# Patient Record
Sex: Male | Born: 1965
Health system: Southern US, Community
[De-identification: ages and names within clinical notes are randomized; demographics above are authoritative.]

## PROBLEM LIST (undated history)

## (undated) HISTORY — PX: NASAL SINUS SURGERY: SHX719

## (undated) HISTORY — PX: ESOPHAGEAL DILATION: SHX303

---

## 2010-09-29 ENCOUNTER — Ambulatory Visit: Payer: Self-pay | Admitting: Family Medicine

## 2012-09-21 ENCOUNTER — Ambulatory Visit: Payer: Self-pay | Admitting: Otolaryngology

## 2013-11-13 IMAGING — RF DG BARIUM SWALLOW
8 series · 14 of 24 positions shown · non-contrast
Comparison: none

REASON FOR EXAM: dysphagia
COMMENTS:

[Series 1: fluoro_barium 2fps_bw · 0.17mm/px · 1 of 24 frames shown (1 of 8)]
[frame 4/24]
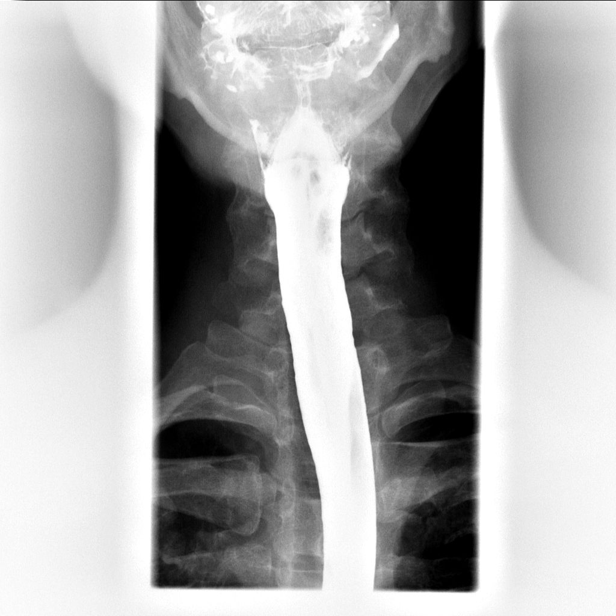

[Series 2: fluoro_barium 2fps_bw · 0.17mm/px · 2 of 24 frames shown (2 of 8)]
[frame 4/24]
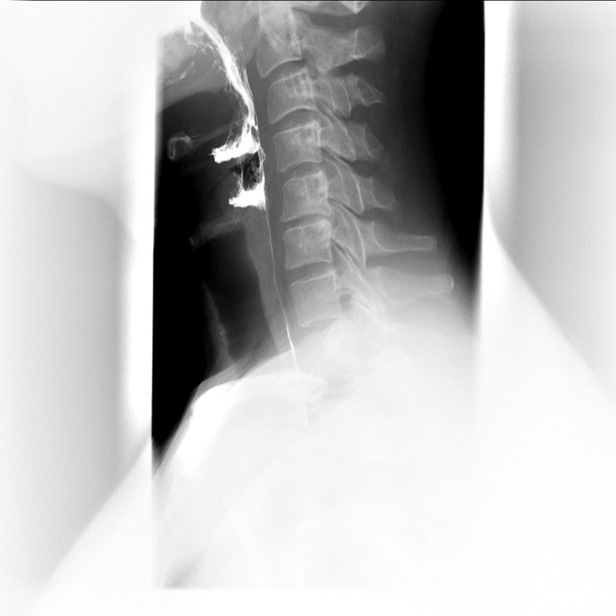
[frame 16/24]
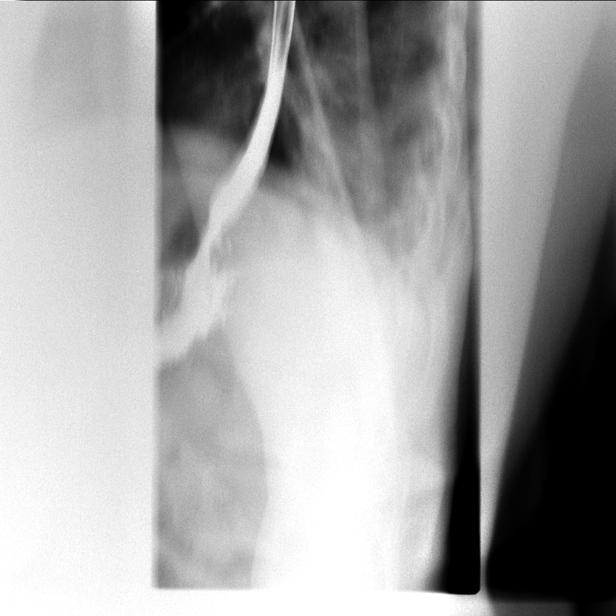

[Series 3: fluoro_barium 2fps_bw · 0.17mm/px · 2 of 27 frames shown (3 of 8)]
[frame 10/27]
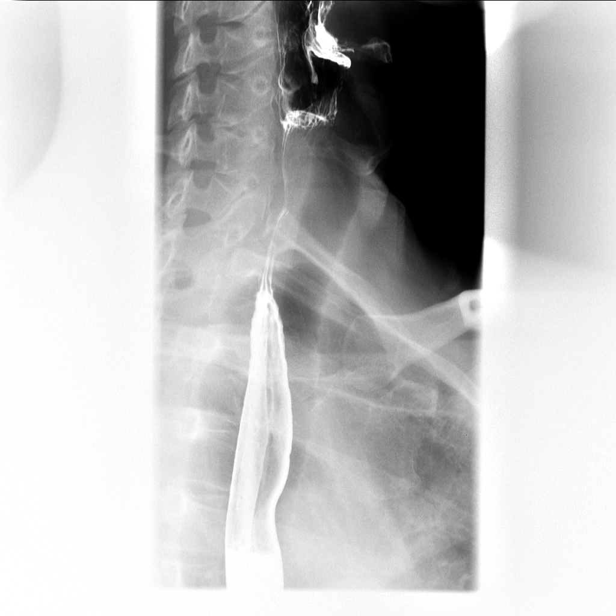
[frame 14/27]
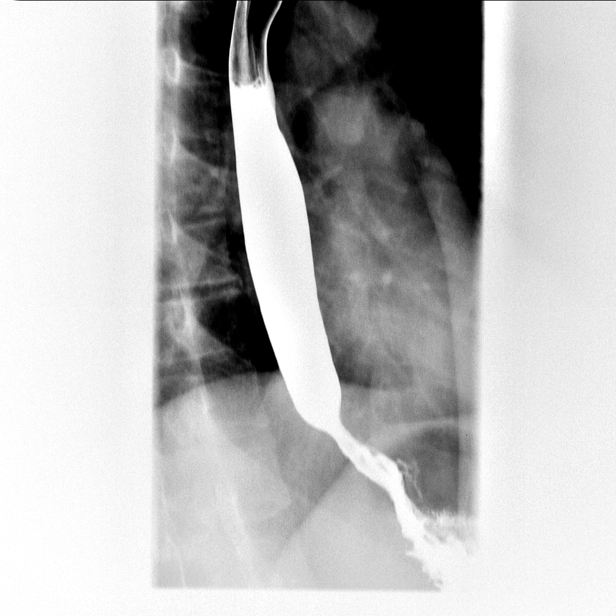

[Series 4: fluoro_barium 2fps_bw · 0.17mm/px · 2 of 39 frames shown (4 of 8)]
[frame 6/39]
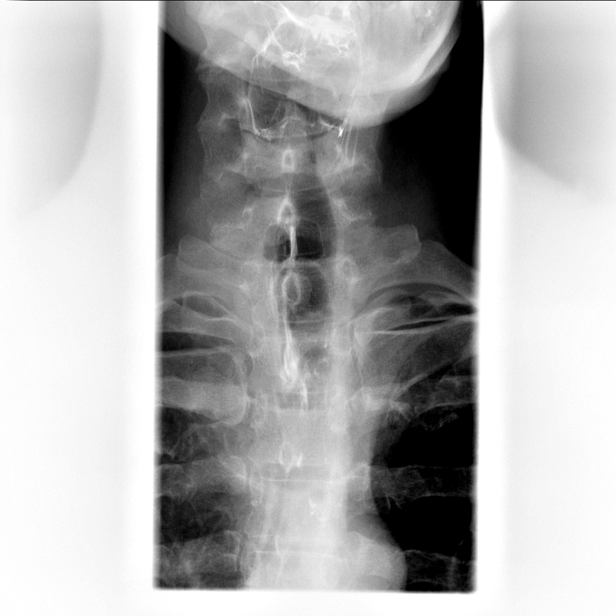
[frame 34/39]
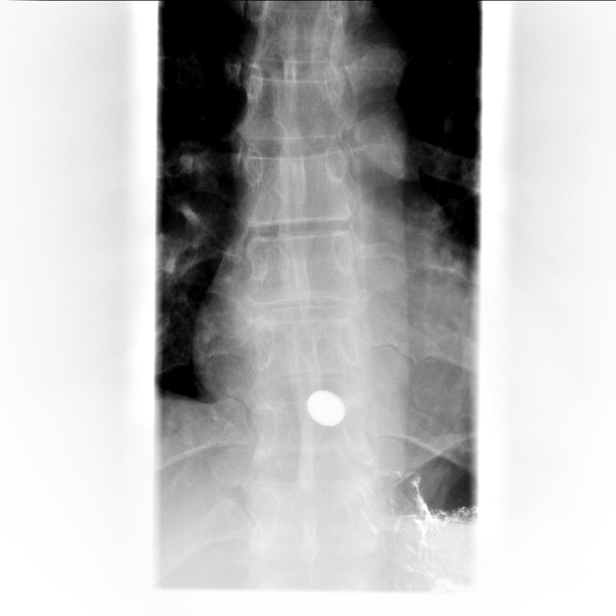

[Series 5: fluoro_barium 2fps_bw · 0.17mm/px · 2 of 27 frames shown (5 of 8)]
[frame 3/27]
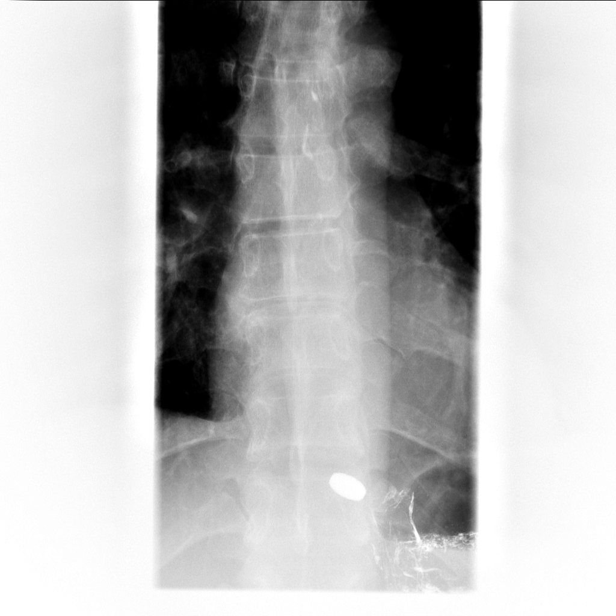
[frame 23/27]
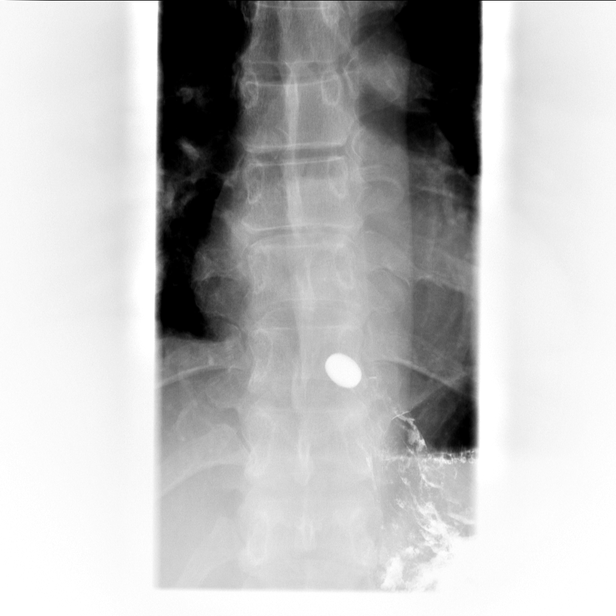

[Series 6: fluoro_barium 2fps_bw · 0.17mm/px · 1 of 14 frames shown (6 of 8)]
[frame 7/14]
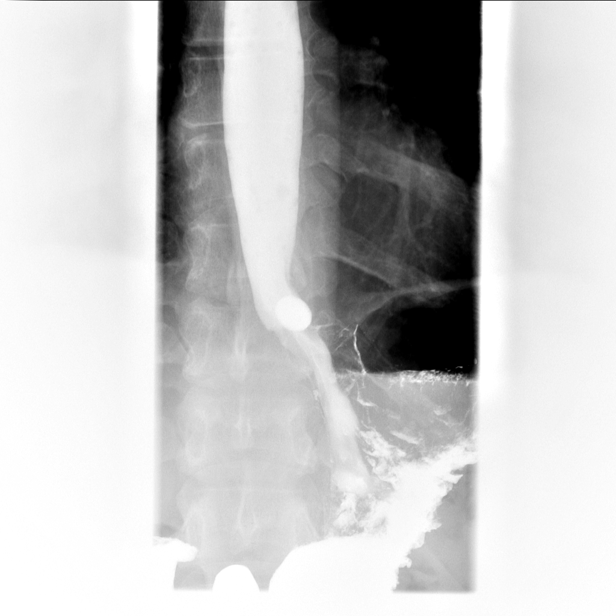

[Series 7: fluoro_barium 2fps_bw · 0.17mm/px · 3 of 33 frames shown (7 of 8)]
[frame 1/33]
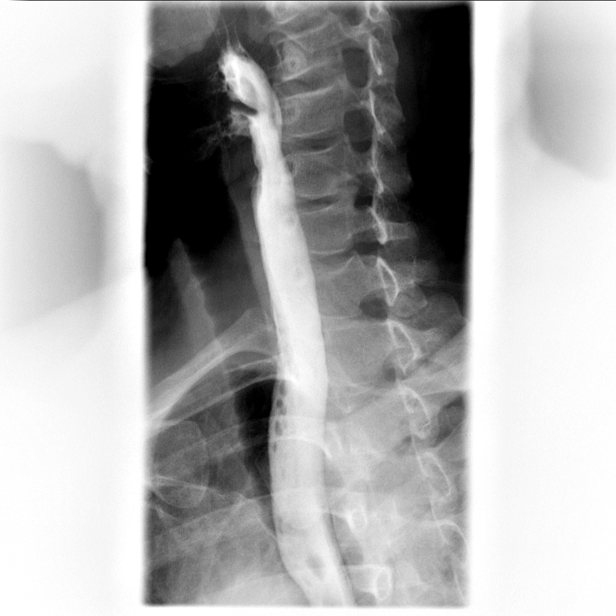
[frame 5/33]
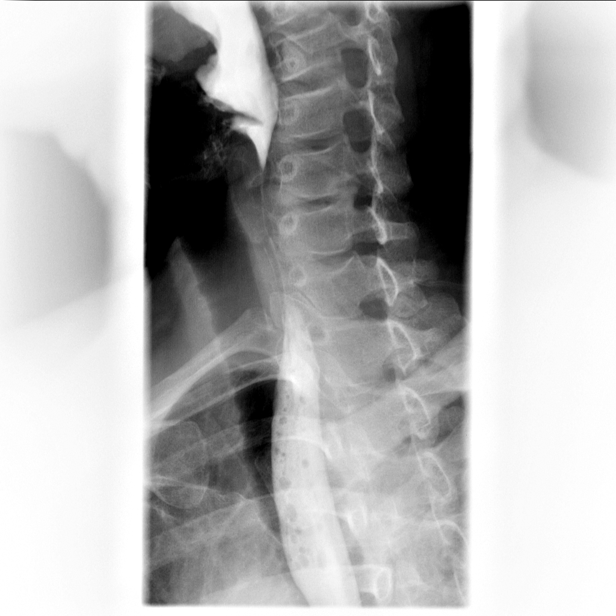
[frame 29/33]
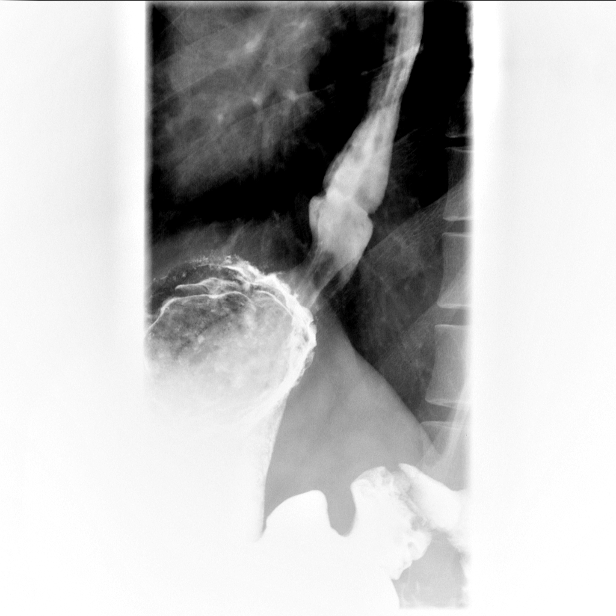

[Series 8: fluoro_barium 2fps_bw · 0.18mm/px · 1 of 30 frames shown (8 of 8)]
[frame 26/30]
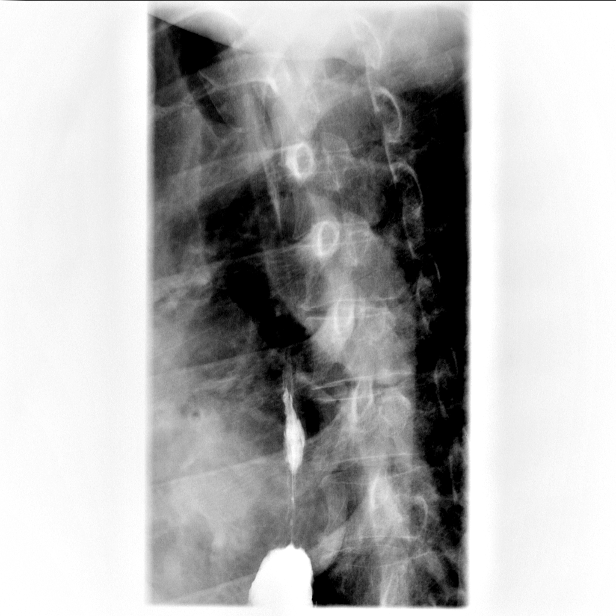

[14 of 24 positions shown; findings below may reference images not displayed]

PROCEDURE:     FL  - FL BARIUM SWALLOW  - September 21, 2012  [DATE]

RESULT:

Barium swallow examination shows the patient easily ingests the liquid
barium. The barium flows through the esophagus into the stomach without
impingement. There is no esophageal diverticulum or persistent stenosis
appreciated proximally or in the mid portion. A barium impregnated tablet
passed to the distal esophagus and paused. With additional swallows of
water, the tablet would not pass into the stomach. Additional large swallows
of barium helped propel the tablet into the stomach. Prone barium swallow in
a right anterior oblique projection demonstrates a small Schatzki's ring
which likely was the cause of the hesitance of passage of the barium tablet
into the stomach. A small, sliding-type, hiatal hernia appears present.
There is gastroesophageal reflux into the distal third of the esophagus with
reflux maneuvers.
IMPRESSION: No significant obstruction. Small, Schatzki's ring noted in
the distal esophagus with a sliding type, hiatal hernia. Gastroesophageal
reflux into the distal esophagus with reflux maneuvers. No esophageal
mucosal abnormality is evident.

[REDACTED]

## 2015-02-01 ENCOUNTER — Ambulatory Visit: Payer: Self-pay | Admitting: Family Medicine

## 2016-01-16 ENCOUNTER — Other Ambulatory Visit: Payer: Self-pay | Admitting: Family Medicine

## 2016-01-16 ENCOUNTER — Encounter: Payer: Self-pay | Admitting: Family Medicine

## 2016-01-16 ENCOUNTER — Ambulatory Visit (INDEPENDENT_AMBULATORY_CARE_PROVIDER_SITE_OTHER): Payer: BLUE CROSS/BLUE SHIELD | Admitting: Family Medicine

## 2016-01-16 VITALS — BP 124/84 | HR 68 | Temp 98.0°F | Ht 68.0 in | Wt 179.0 lb

## 2016-01-16 DIAGNOSIS — F329 Major depressive disorder, single episode, unspecified: Secondary | ICD-10-CM | POA: Diagnosis not present

## 2016-01-16 DIAGNOSIS — F32A Depression, unspecified: Secondary | ICD-10-CM | POA: Insufficient documentation

## 2016-01-16 DIAGNOSIS — Z0000 Encounter for general adult medical examination without abnormal findings: Secondary | ICD-10-CM

## 2016-01-16 LAB — UA/M W/RFLX CULTURE, ROUTINE
BILIRUBIN UA: NEGATIVE
GLUCOSE, UA: NEGATIVE
Ketones, UA: NEGATIVE
Leukocytes, UA: NEGATIVE
Nitrite, UA: NEGATIVE
Protein, UA: NEGATIVE
RBC UA: NEGATIVE
SPEC GRAV UA: 1.015 (ref 1.005–1.030)
UUROB: 1 mg/dL (ref 0.2–1.0)
pH, UA: 7 (ref 5.0–7.5)

## 2016-01-16 MED ORDER — FLUOXETINE HCL 20 MG PO TABS: ORAL_TABLET | ORAL | Status: DC

## 2016-01-16 NOTE — Patient Instructions (Signed)

## 2016-01-16 NOTE — Progress Notes (Signed)
BP 124/84 mmHg  Pulse 68  Temp(Src) 98 F (36.7 C)  Ht 5\' 8"  (1.727 m)  Wt 179 lb (81.194 kg)  BMI 27.22 kg/m2  SpO2 99%   Subjective:    Patient ID: Richard Houston, male    DOB: 1966/11/24, 50 y.o.   MRN: [id number]  HPI: Richard Houston is a 50 y.o. male presenting on 01/16/2016 for comprehensive medical examination. Current medical complaints include:  DEPRESSION Mood status: uncontrolled Satisfied with current treatment?: no Symptom severity: moderate  Duration of current treatment : Has been on things off and on- thinks he needs to go back on something Psychotherapy/counseling: no  Previous psychiatric medications: lexapro and prozac Depressed mood: yes Anxious mood: yes Anhedonia: no Significant weight loss or gain: no Insomnia: no  Fatigue: yes Feelings of worthlessness or guilt: yes Impaired concentration/indecisiveness: yes Suicidal ideations: no Hopelessness: no Crying spells: no Depression screen PHQ 2/9 01/16/2016  Decreased Interest 1  Down, Depressed, Hopeless 2  PHQ - 2 Score 3  Altered sleeping 3  Tired, decreased energy 3  Change in appetite 0  Feeling bad or failure about yourself  2  Trouble concentrating 2  Moving slowly or fidgety/restless 0  Suicidal thoughts 0  PHQ-9 Score 13  Difficult doing work/chores Somewhat difficult    He currently lives with: his wife and kids Interim Problems from his last visit: no  The patient does not have a history of falls. I did not complete a risk assessment for falls. A plan of care for falls was not documented.  Past Medical History:  History reviewed. No pertinent past medical history.  Surgical History:  Past Surgical History  Procedure Laterality Date  . Nasal sinus surgery      X 2  . Esophageal dilation      Medications:  Current Outpatient Prescriptions on File Prior to Visit  Medication Sig  . omeprazole (PRILOSEC) 20 MG capsule Take 20 mg by mouth daily.   No current  facility-administered medications on file prior to visit.    Allergies:  Allergies  Allergen Reactions  . Lexapro [Escitalopram] Anxiety    Dizziness    Social History:  Social History   Social History  . Marital Status: Married    Spouse Name: N/A  . Number of Children: N/A  . Years of Education: N/A   Occupational History  . Not on file.   Social History Main Topics  . Smoking status: Never Smoker   . Smokeless tobacco: Never Used  . Alcohol Use: Yes     Comment: Socially  . Drug Use: No  . Sexual Activity: Yes    Birth Control/ Protection: None   Other Topics Concern  . Not on file   Social History Narrative   History  Smoking status  . Never Smoker   Smokeless tobacco  . Never Used   History  Alcohol Use  . Yes    Comment: Socially    Family History:  Family History  Problem Relation Age of Onset  . Stroke Father   . Alcohol abuse Father   . Heart disease Father     Past medical history, surgical history, medications, allergies, family history and social history reviewed with patient today and changes made to appropriate areas of the chart.   Review of Systems  Constitutional: Negative.   HENT: Negative.   Eyes: Negative.   Respiratory: Negative.   Cardiovascular: Negative.   Gastrointestinal: Positive for heartburn. Negative for nausea, vomiting, abdominal pain, diarrhea, constipation,  blood in stool and melena.  Genitourinary: Negative.   Musculoskeletal: Negative.   Skin: Negative.   Neurological: Negative.   Endo/Heme/Allergies: Positive for environmental allergies. Negative for polydipsia. Does not bruise/bleed easily.  Psychiatric/Behavioral: Positive for depression. Negative for suicidal ideas, hallucinations, memory loss and substance abuse. The patient is not nervous/anxious and does not have insomnia.     All other ROS negative except what is listed above and in the HPI.      Objective:    BP 124/84 mmHg  Pulse 68   Temp(Src) 98 F (36.7 C)  Ht  (1.727 m)  Wt 179 lb (81.194 kg)  BMI 27.22 kg/m2  SpO2 99%  Wt Readings from Last 3 Encounters:  01/16/16 179 lb (81.194 kg)  12/10/13 187 lb (84.823 kg)    Physical Exam  Constitutional: He is oriented to person, place, and time. He appears well-developed and well-nourished. No distress.  HENT:  Head: Normocephalic and atraumatic.  Right Ear: Hearing, tympanic membrane, external ear and ear canal normal.  Left Ear: Hearing, tympanic membrane, external ear and ear canal normal.  Nose: Nose normal.  Mouth/Throat: Uvula is midline, oropharynx is clear and moist and mucous membranes are normal. No oropharyngeal exudate.  Eyes: Conjunctivae, EOM and lids are normal. Pupils are equal, round, and reactive to light. Right eye exhibits no discharge. Left eye exhibits no discharge. No scleral icterus.  Neck: Normal range of motion. Neck supple. No JVD present. No tracheal deviation present. No thyromegaly present.  Cardiovascular: Normal rate, regular rhythm, normal heart sounds and intact distal pulses.  Exam reveals no gallop and no friction rub.   No murmur heard. Pulmonary/Chest: Effort normal and breath sounds normal. No stridor. No respiratory distress. He has no wheezes. He has no rales. He exhibits no tenderness.  Abdominal: Soft. Bowel sounds are normal. He exhibits no distension and no mass. There is no tenderness. There is no rebound and no guarding. Hernia confirmed negative in the right inguinal area and confirmed negative in the left inguinal area.  Genitourinary: Rectum normal, testes normal and penis normal. Prostate is enlarged. Prostate is not tender. Cremasteric reflex is present. Right testis shows no mass, no swelling and no tenderness. Right testis is descended. Cremasteric reflex is not absent on the right side. Left testis shows no mass, no swelling and no tenderness. Left testis is descended. Cremasteric reflex is not absent on the left  side. Circumcised. No phimosis, paraphimosis, hypospadias, penile erythema or penile tenderness. No discharge found.  Musculoskeletal: Normal range of motion. He exhibits no edema or tenderness.  Lymphadenopathy:    He has no cervical adenopathy.       Right: No inguinal adenopathy present.       Left: No inguinal adenopathy present.  Neurological: He is alert and oriented to person, place, and time. He has normal reflexes. He displays normal reflexes. No cranial nerve deficit. He exhibits normal muscle tone. Coordination normal.  Skin: Skin is warm, dry and intact. No rash noted. He is not diaphoretic. No erythema. No pallor.  Psychiatric: He has a normal mood and affect. His speech is normal and behavior is normal. Judgment and thought content normal. Cognition and memory are normal.  Nursing note and vitals reviewed.   No results found for this or any previous visit.    Assessment & Plan:   Problem List Items Addressed This Visit      Other   Depression    Not under good control. Having a  lot of issues at home. Will start on low dose prozac and check back in in 1 month.       Relevant Medications   FLUoxetine (PROZAC) 20 MG tablet    Other Visit Diagnoses    Routine general medical examination at a health care facility    -  Primary    Screening labs checked today. Up to date on Tdap. Refused flu. Colonoscopy up to date. Work on diet and exercise.         Discussed aspirin prophylaxis for myocardial infarction prevention and decision was it was not indicated  LABORATORY TESTING:  Health maintenance labs ordered today as discussed above.   The natural history of prostate cancer and ongoing controversy regarding screening and potential treatment outcomes of prostate cancer has been discussed with the patient. The meaning of a false positive PSA and a false negative PSA has been discussed. He indicates understanding of the limitations of this screening test and wishes to proceed  with screening PSA testing.   IMMUNIZATIONS:   - Tdap: Tetanus vaccination status reviewed: last tetanus booster within 10 years. - Influenza: Refused - Pneumovax: Not applicable  SCREENING: - Colonoscopy: Up to date  Discussed with patient purpose of the colonoscopy is to detect colon cancer at curable precancerous or early stages   PATIENT COUNSELING:    Sexuality: Discussed sexually transmitted diseases, partner selection, use of condoms, avoidance of unintended pregnancy  and contraceptive alternatives.   Advised to avoid cigarette smoking.  I discussed with the patient that most people either abstain from alcohol or drink within safe limits (<=14/week and <=4 drinks/occasion for males, <=7/weeks and <= 3 drinks/occasion for females) and that the risk for alcohol disorders and other health effects rises proportionally with the number of drinks per week and how often a drinker exceeds daily limits.  Discussed cessation/primary prevention of drug use and availability of treatment for abuse.   Diet: Encouraged to adjust caloric intake to maintain  or achieve ideal body weight, to reduce intake of dietary saturated fat and total fat, to limit sodium intake by avoiding high sodium foods and not adding table salt, and to maintain adequate dietary potassium and calcium preferably from fresh fruits, vegetables, and low-fat dairy products.    stressed the importance of regular exercise  Injury prevention: Discussed safety belts, safety helmets, smoke detector, smoking near bedding or upholstery.   Dental health: Discussed importance of regular tooth brushing, flossing, and dental visits.   Follow up plan: NEXT PREVENTATIVE PHYSICAL DUE IN 1 YEAR. Return in about 4 weeks (around 02/13/2016) for follow up depression.

## 2016-01-16 NOTE — Assessment & Plan Note (Signed)
Not under good control. Having a lot of issues at home. Will start on low dose prozac and check back in in 1 month.

## 2016-01-17 LAB — LIPID PANEL W/O CHOL/HDL RATIO
CHOLESTEROL TOTAL: 170 mg/dL (ref 100–199)
HDL: 61 mg/dL (ref >39)
LDL CALC: 89 mg/dL (ref 0–99)
TRIGLYCERIDES: 98 mg/dL (ref 0–149)
VLDL Cholesterol Cal: 20 mg/dL (ref 5–40)

## 2016-01-17 LAB — CBC WITH DIFFERENTIAL/PLATELET
BASOS ABS: 0.1 10*3/uL (ref 0.0–0.2)
Basos: 1 %
EOS (ABSOLUTE): 0.4 10*3/uL (ref 0.0–0.4)
EOS: 7 %
Hematocrit: 42.1 % (ref 37.5–51.0)
Hemoglobin: 14.5 g/dL (ref 12.6–17.7)
IMMATURE GRANULOCYTES: 0 %
Immature Grans (Abs): 0 10*3/uL (ref 0.0–0.1)
Lymphocytes Absolute: 2.1 10*3/uL (ref 0.7–3.1)
Lymphs: 33 %
MCH: 31.3 pg (ref 26.6–33.0)
MCHC: 34.4 g/dL (ref 31.5–35.7)
MCV: 91 fL (ref 79–97)
MONOS ABS: 0.6 10*3/uL (ref 0.1–0.9)
Monocytes: 10 %
NEUTROS PCT: 49 %
Neutrophils Absolute: 3.1 10*3/uL (ref 1.4–7.0)
PLATELETS: 187 10*3/uL (ref 150–379)
RBC: 4.63 x10E6/uL (ref 4.14–5.80)
RDW: 13.7 % (ref 12.3–15.4)
WBC: 6.3 10*3/uL (ref 3.4–10.8)

## 2016-01-17 LAB — COMPREHENSIVE METABOLIC PANEL
ALK PHOS: 72 IU/L (ref 39–117)
ALT: 14 IU/L (ref 0–44)
AST: 16 IU/L (ref 0–40)
Albumin/Globulin Ratio: 2.4 (ref 1.1–2.5)
Albumin: 4.5 g/dL (ref 3.5–5.5)
BUN/Creatinine Ratio: 16 (ref 9–20)
BUN: 16 mg/dL (ref 6–24)
Bilirubin Total: 0.7 mg/dL (ref 0.0–1.2)
CALCIUM: 9.1 mg/dL (ref 8.7–10.2)
CO2: 26 mmol/L (ref 18–29)
CREATININE: 0.98 mg/dL (ref 0.76–1.27)
Chloride: 102 mmol/L (ref 96–106)
GFR calc Af Amer: 103 mL/min/{1.73_m2} (ref >59)
GFR, EST NON AFRICAN AMERICAN: 89 mL/min/{1.73_m2} (ref >59)
GLUCOSE: 89 mg/dL (ref 65–99)
Globulin, Total: 1.9 g/dL (ref 1.5–4.5)
Potassium: 4.7 mmol/L (ref 3.5–5.2)
Sodium: 144 mmol/L (ref 134–144)
Total Protein: 6.4 g/dL (ref 6.0–8.5)

## 2016-01-17 LAB — TSH: TSH: 1.75 u[IU]/mL (ref 0.450–4.500)

## 2016-01-17 LAB — PSA: Prostate Specific Ag, Serum: 0.4 ng/mL (ref 0.0–4.0)

## 2016-01-19 ENCOUNTER — Encounter: Payer: Self-pay | Admitting: Family Medicine

## 2016-03-18 ENCOUNTER — Other Ambulatory Visit: Payer: Self-pay | Admitting: Family Medicine

## 2016-05-21 DIAGNOSIS — D125 Benign neoplasm of sigmoid colon: Secondary | ICD-10-CM | POA: Diagnosis not present

## 2016-05-21 DIAGNOSIS — K5730 Diverticulosis of large intestine without perforation or abscess without bleeding: Secondary | ICD-10-CM | POA: Diagnosis not present

## 2016-05-21 DIAGNOSIS — K449 Diaphragmatic hernia without obstruction or gangrene: Secondary | ICD-10-CM | POA: Diagnosis not present

## 2016-05-21 DIAGNOSIS — Z7951 Long term (current) use of inhaled steroids: Secondary | ICD-10-CM | POA: Diagnosis not present

## 2016-05-21 DIAGNOSIS — Z1211 Encounter for screening for malignant neoplasm of colon: Secondary | ICD-10-CM | POA: Diagnosis not present

## 2016-05-21 DIAGNOSIS — D126 Benign neoplasm of colon, unspecified: Secondary | ICD-10-CM | POA: Diagnosis not present

## 2016-05-21 DIAGNOSIS — K210 Gastro-esophageal reflux disease with esophagitis: Secondary | ICD-10-CM | POA: Diagnosis not present

## 2016-05-21 DIAGNOSIS — K200 Eosinophilic esophagitis: Secondary | ICD-10-CM | POA: Diagnosis not present

## 2016-05-21 DIAGNOSIS — K219 Gastro-esophageal reflux disease without esophagitis: Secondary | ICD-10-CM | POA: Diagnosis not present

## 2016-05-21 DIAGNOSIS — Z79899 Other long term (current) drug therapy: Secondary | ICD-10-CM | POA: Diagnosis not present

## 2016-05-21 DIAGNOSIS — K222 Esophageal obstruction: Secondary | ICD-10-CM | POA: Diagnosis not present

## 2016-05-21 DIAGNOSIS — R1310 Dysphagia, unspecified: Secondary | ICD-10-CM | POA: Diagnosis not present

## 2016-05-21 DIAGNOSIS — K635 Polyp of colon: Secondary | ICD-10-CM | POA: Diagnosis not present

## 2016-05-21 DIAGNOSIS — D123 Benign neoplasm of transverse colon: Secondary | ICD-10-CM | POA: Diagnosis not present

## 2016-05-21 DIAGNOSIS — D120 Benign neoplasm of cecum: Secondary | ICD-10-CM | POA: Diagnosis not present

## 2016-05-21 DIAGNOSIS — D122 Benign neoplasm of ascending colon: Secondary | ICD-10-CM | POA: Diagnosis not present

## 2016-05-21 DIAGNOSIS — K228 Other specified diseases of esophagus: Secondary | ICD-10-CM | POA: Diagnosis not present

## 2016-05-21 DIAGNOSIS — J45909 Unspecified asthma, uncomplicated: Secondary | ICD-10-CM | POA: Diagnosis not present

## 2016-11-28 DIAGNOSIS — J019 Acute sinusitis, unspecified: Secondary | ICD-10-CM | POA: Diagnosis not present

## 2016-12-01 ENCOUNTER — Other Ambulatory Visit: Payer: Self-pay | Admitting: Family Medicine

## 2016-12-01 NOTE — Telephone Encounter (Signed)
First prescribed on 01/16/16. A&P from that date stated, "Return in about 4 weeks (around 02/13/2016) for follow up depression. " No followed kept. No upcoming visit on file.

## 2016-12-01 NOTE — Telephone Encounter (Signed)
He shouldn't have had enough to be taking it consistently. Please find out if he's been taking it, either way, he needs an appointment.

## 2016-12-02 NOTE — Telephone Encounter (Signed)
Called patient, he is at work, he states that he will call back.

## 2016-12-03 ENCOUNTER — Other Ambulatory Visit: Payer: Self-pay

## 2016-12-03 NOTE — Telephone Encounter (Signed)
Routing to provider  

## 2016-12-03 NOTE — Telephone Encounter (Signed)
He should not have had enough to be taking it consistently. Please find out if he only started it 1 month ago, or if he has been out of it entirely. Either way, he will need an appointment, it will just depend if he gets a new Rx or if he needs to wait.

## 2017-05-11 ENCOUNTER — Encounter: Payer: Self-pay | Admitting: Family Medicine

## 2017-05-11 ENCOUNTER — Ambulatory Visit (INDEPENDENT_AMBULATORY_CARE_PROVIDER_SITE_OTHER): Payer: BLUE CROSS/BLUE SHIELD | Admitting: Family Medicine

## 2017-05-11 VITALS — BP 133/87 | HR 73 | Temp 98.2°F | Ht 68.4 in | Wt 184.5 lb

## 2017-05-11 DIAGNOSIS — Z0000 Encounter for general adult medical examination without abnormal findings: Secondary | ICD-10-CM

## 2017-05-11 DIAGNOSIS — Z114 Encounter for screening for human immunodeficiency virus [HIV]: Secondary | ICD-10-CM

## 2017-05-11 DIAGNOSIS — F3342 Major depressive disorder, recurrent, in full remission: Secondary | ICD-10-CM

## 2017-05-11 DIAGNOSIS — Z125 Encounter for screening for malignant neoplasm of prostate: Secondary | ICD-10-CM

## 2017-05-11 DIAGNOSIS — Z1159 Encounter for screening for other viral diseases: Secondary | ICD-10-CM | POA: Diagnosis not present

## 2017-05-11 DIAGNOSIS — J301 Allergic rhinitis due to pollen: Secondary | ICD-10-CM

## 2017-05-11 LAB — MICROSCOPIC EXAMINATION
Bacteria, UA: NONE SEEN
WBC UA: NONE SEEN /HPF (ref 0–?)

## 2017-05-11 LAB — UA/M W/RFLX CULTURE, ROUTINE
Bilirubin, UA: NEGATIVE
GLUCOSE, UA: NEGATIVE
Ketones, UA: NEGATIVE
Leukocytes, UA: NEGATIVE
NITRITE UA: NEGATIVE
Protein, UA: NEGATIVE
RBC, UA: NEGATIVE
Specific Gravity, UA: 1.01 (ref 1.005–1.030)
UUROB: 0.2 mg/dL (ref 0.2–1.0)
pH, UA: 7 (ref 5.0–7.5)

## 2017-05-11 MED ORDER — LORATADINE 10 MG PO TABS: 10.0000 mg | ORAL_TABLET | Freq: Every day | ORAL | 11 refills | Status: AC

## 2017-05-11 MED ORDER — FLUTICASONE PROPIONATE 50 MCG/ACT NA SUSP: 2.0000 | Freq: Every day | NASAL | 11 refills | Status: AC

## 2017-05-11 NOTE — Progress Notes (Signed)
BP 133/87 (BP Location: Left Arm, Patient Position: Sitting, Cuff Size: Normal)   Pulse 73   Temp 98.2 F (36.8 C)   Ht 5' 8.4" (1.737 m)   Wt 184 lb 8 oz (83.7 kg)   SpO2 98%   BMI 27.73 kg/m    Subjective:    Patient ID: Richard Houston, male    DOB: 16-Jul-1966, 51 y.o.   MRN: [id number]  HPI: Richard Houston is a 51 y.o. male presenting on 05/11/2017 for comprehensive medical examination. Current medical complaints include:  DEPRESSION- resolved, off meds, feeling well.   ALLERGIES Duration: chronic Runny nose: yes  Nasal congestion: yes Nasal itching: yes Sneezing: yes Eye swelling, itching or discharge: yes Post nasal drip: yes Cough: yes, non-productive Sinus pressure: yes  Ear pain: no  Ear pressure: no  Fever: no  Symptoms occur seasonally: yes Symptoms occur perenially: yes Satisfied with current treatment: no Allergist evaluation in past: no Allergen injection immunotherapy: no Recurrent sinus infections: no ENT evaluation in past: no Known environmental allergy: yes Indoor pets: yes History of asthma: no Current allergy medications: none Treatments attempted: loratidine  He currently lives with: wife and son Interim Problems from his last visit: no  Depression Screen done today and results listed below:  Depression screen [facility] 2/9 05/11/2017 01/16/2016  Decreased Interest 0 1  Down, Depressed, Hopeless 0 2  PHQ - 2 Score 0 3  Altered sleeping - 3  Tired, decreased energy - 3  Change in appetite - 0  Feeling bad or failure about yourself  - 2  Trouble concentrating - 2  Moving slowly or fidgety/restless - 0  Suicidal thoughts - 0  PHQ-9 Score - 13  Difficult doing work/chores - Somewhat difficult    Past Medical History:  History reviewed. No pertinent past medical history.  Surgical History:  Past Surgical History:  Procedure Laterality Date  . ESOPHAGEAL DILATION    . NASAL SINUS SURGERY     X 2    Medications:  Current Outpatient  Prescriptions on File Prior to Visit  Medication Sig  . omeprazole (PRILOSEC) 20 MG capsule Take 20 mg by mouth daily.   No current facility-administered medications on file prior to visit.     Allergies:  Allergies  Allergen Reactions  . Lexapro [Escitalopram] Anxiety    Dizziness    Social History:  Social History   Social History  . Marital status: Married    Spouse name: N/A  . Number of children: N/A  . Years of education: N/A   Occupational History  . Not on file.   Social History Main Topics  . Smoking status: Never Smoker  . Smokeless tobacco: Never Used  . Alcohol use Yes     Comment: Socially  . Drug use: No  . Sexual activity: Yes    Birth control/ protection: None   Other Topics Concern  . Not on file   Social History Narrative  . No narrative on file   History  Smoking Status  . Never Smoker  Smokeless Tobacco  . Never Used   History  Alcohol Use  . Yes    Comment: Socially    Family History:  Family History  Problem Relation Age of Onset  . Stroke Father   . Alcohol abuse Father   . Heart disease Father     Past medical history, surgical history, medications, allergies, family history and social history reviewed with patient today and changes made to appropriate areas of the  chart.   Review of Systems  Constitutional: Negative.   HENT: Positive for congestion. Negative for ear discharge, ear pain, hearing loss, nosebleeds, sinus pain, sore throat and tinnitus.   Eyes: Negative.   Respiratory: Negative.  Negative for stridor.   Cardiovascular: Negative.   Gastrointestinal: Positive for heartburn. Negative for abdominal pain, blood in stool, constipation, diarrhea, melena, nausea and vomiting.  Genitourinary: Negative.   Musculoskeletal: Negative.   Skin: Negative.   Neurological: Negative.   Endo/Heme/Allergies: Positive for environmental allergies. Negative for polydipsia. Does not bruise/bleed easily.  Psychiatric/Behavioral:  Negative.     All other ROS negative except what is listed above and in the HPI.      Objective:    BP 133/87 (BP Location: Left Arm, Patient Position: Sitting, Cuff Size: Normal)   Pulse 73   Temp 98.2 F (36.8 C)   Ht 5' 8.4" (1.737 m)   Wt 184 lb 8 oz (83.7 kg)   SpO2 98%   BMI 27.73 kg/m   Wt Readings from Last 3 Encounters:  05/11/17 184 lb 8 oz (83.7 kg)  01/16/16 179 lb (81.2 kg)  12/10/13 187 lb (84.8 kg)    Physical Exam  Constitutional: He is oriented to person, place, and time. He appears well-developed and well-nourished. No distress.  HENT:  Head: Normocephalic and atraumatic.  Right Ear: Hearing, tympanic membrane, external ear and ear canal normal.  Left Ear: Hearing, tympanic membrane, external ear and ear canal normal.  Nose: Nose normal.  Mouth/Throat: Uvula is midline, oropharynx is clear and moist and mucous membranes are normal. No oropharyngeal exudate.  Eyes: Conjunctivae, EOM and lids are normal. Pupils are equal, round, and reactive to light. Right eye exhibits no discharge. Left eye exhibits no discharge. No scleral icterus.  Neck: Normal range of motion. Neck supple. No JVD present. No tracheal deviation present. No thyromegaly present.  Cardiovascular: Normal rate, regular rhythm, normal heart sounds and intact distal pulses.  Exam reveals no gallop and no friction rub.   No murmur heard. Pulmonary/Chest: Effort normal and breath sounds normal. No stridor. No respiratory distress. He has no wheezes. He has no rales. He exhibits no tenderness.  Abdominal: Soft. Bowel sounds are normal. He exhibits no distension and no mass. There is no tenderness. There is no rebound and no guarding. Hernia confirmed negative in the right inguinal area and confirmed negative in the left inguinal area.  Genitourinary: Rectum normal, testes normal and penis normal. Prostate is enlarged. Prostate is not tender. Right testis shows no mass, no swelling and no tenderness.  Right testis is descended. Cremasteric reflex is not absent on the right side. Left testis shows no mass, no swelling and no tenderness. Left testis is descended. Cremasteric reflex is not absent on the left side. Circumcised. No phimosis, paraphimosis, hypospadias, penile erythema or penile tenderness. No discharge found.  Musculoskeletal: Normal range of motion. He exhibits no edema, tenderness or deformity.  Lymphadenopathy:    He has no cervical adenopathy.  Neurological: He is alert and oriented to person, place, and time. He has normal reflexes. He displays normal reflexes. No cranial nerve deficit. He exhibits normal muscle tone. Coordination normal.  Skin: Skin is warm, dry and intact. No rash noted. He is not diaphoretic. No erythema. No pallor.  Psychiatric: He has a normal mood and affect. His speech is normal and behavior is normal. Judgment and thought content normal. Cognition and memory are normal.  Nursing note and vitals reviewed.   Results for  orders placed or performed in visit on 01/16/16  CBC with Differential/Platelet  Result Value Ref Range   WBC 6.3 3.4 - 10.8 x10E3/uL   RBC 4.63 4.14 - 5.80 x10E6/uL   Hemoglobin 14.5 12.6 - 17.7 g/dL   Hematocrit 96.0 45.4 - 51.0 %   MCV 91 79 - 97 fL   MCH 31.3 26.6 - 33.0 pg   MCHC 34.4 31.5 - 35.7 g/dL   RDW 09.8 11.9 - 14.7 %   Platelets 187 150 - 379 x10E3/uL   Neutrophils 49 %   Lymphs 33 %   Monocytes 10 %   Eos 7 %   Basos 1 %   Neutrophils Absolute 3.1 1.4 - 7.0 x10E3/uL   Lymphocytes Absolute 2.1 0.7 - 3.1 x10E3/uL   Monocytes Absolute 0.6 0.1 - 0.9 x10E3/uL   EOS (ABSOLUTE) 0.4 0.0 - 0.4 x10E3/uL   Basophils Absolute 0.1 0.0 - 0.2 x10E3/uL   Immature Granulocytes 0 %   Immature Grans (Abs) 0.0 0.0 - 0.1 x10E3/uL  Comprehensive metabolic panel  Result Value Ref Range   Glucose 89 65 - 99 mg/dL   BUN 16 6 - 24 mg/dL   Creatinine, Ser 8.29 0.76 - 1.27 mg/dL   GFR calc non Af Amer 89 >59 mL/min/1.73   GFR calc  Af Amer 103 >59 mL/min/1.73   BUN/Creatinine Ratio 16 9 - 20   Sodium 144 134 - 144 mmol/L   Potassium 4.7 3.5 - 5.2 mmol/L   Chloride 102 96 - 106 mmol/L   CO2 26 18 - 29 mmol/L   Calcium 9.1 8.7 - 10.2 mg/dL   Total Protein 6.4 6.0 - 8.5 g/dL   Albumin 4.5 3.5 - 5.5 g/dL   Globulin, Total 1.9 1.5 - 4.5 g/dL   Albumin/Globulin Ratio 2.4 1.1 - 2.5   Bilirubin Total 0.7 0.0 - 1.2 mg/dL   Alkaline Phosphatase 72 39 - 117 IU/L   AST 16 0 - 40 IU/L   ALT 14 0 - 44 IU/L  Lipid Panel w/o Chol/HDL Ratio  Result Value Ref Range   Cholesterol, Total 170 100 - 199 mg/dL   Triglycerides 98 0 - 149 mg/dL   HDL 61 >56 mg/dL   VLDL Cholesterol Cal 20 5 - 40 mg/dL   LDL Calculated 89 0 - 99 mg/dL  PSA  Result Value Ref Range   Prostate Specific Ag, Serum 0.4 0.0 - 4.0 ng/mL  TSH  Result Value Ref Range   TSH 1.750 0.450 - 4.500 uIU/mL  UA/M w/rflx Culture, Routine  Result Value Ref Range   Specific Gravity, UA 1.015 1.005 - 1.030   pH, UA 7.0 5.0 - 7.5   Color, UA Yellow Yellow   Appearance Ur Clear Clear   Leukocytes, UA Negative Negative   Protein, UA Negative Negative/Trace   Glucose, UA Negative Negative   Ketones, UA Negative Negative   RBC, UA Negative Negative   Bilirubin, UA Negative Negative   Urobilinogen, Ur 1.0 0.2 - 1.0 mg/dL   Nitrite, UA Negative Negative      Assessment & Plan:   Problem List Items Addressed This Visit      Other   Depression    Fully resolved. Call with any concerns.        Other Visit Diagnoses    Routine general medical examination at a health care facility    -  Primary   Vaccines up to date. Screening labs checked today. Colonoscopy up to date. Continue diet and  exercise. Call with any concerns.    Relevant Orders   CBC with Differential/Platelet   Comprehensive metabolic panel   Lipid Panel w/o Chol/HDL Ratio   PSA   TSH   UA/M w/rflx Culture, Routine   Screening for prostate cancer       Labs checked today. Await results.     Relevant Orders   PSA   Encounter for hepatitis C screening test for low risk patient       Labs checked today. Await results.    Relevant Orders   Hepatitis C Antibody   Screening for HIV without presence of risk factors       Labs checked today. Await results.    Relevant Orders   HIV antibody   Seasonal allergic rhinitis due to pollen       WIll start claritin and flonase. Call if not getting better or getting worse.        Discussed aspirin prophylaxis for myocardial infarction prevention and decision was it was not indicated  LABORATORY TESTING:  Health maintenance labs ordered today as discussed above.   The natural history of prostate cancer and ongoing controversy regarding screening and potential treatment outcomes of prostate cancer has been discussed with the patient. The meaning of a false positive PSA and a false negative PSA has been discussed. He indicates understanding of the limitations of this screening test and wishes to proceed with screening PSA testing.   IMMUNIZATIONS:   - Tdap: Tetanus vaccination status reviewed: last tetanus booster within 10 years. - Influenza: Postponed to flu season - Pneumovax: Not applicable - Prevnar: Not applicable - Zostavax vaccine: Will check with his insurance  SCREENING: - Colonoscopy: Up to date  Discussed with patient purpose of the colonoscopy is to detect colon cancer at curable precancerous or early stages   PATIENT COUNSELING:    Sexuality: Discussed sexually transmitted diseases, partner selection, use of condoms, avoidance of unintended pregnancy  and contraceptive alternatives.   Advised to avoid cigarette smoking.  I discussed with the patient that most people either abstain from alcohol or drink within safe limits (<=14/week and <=4 drinks/occasion for males, <=7/weeks and <= 3 drinks/occasion for females) and that the risk for alcohol disorders and other health effects rises proportionally with the number of  drinks per week and how often a drinker exceeds daily limits.  Discussed cessation/primary prevention of drug use and availability of treatment for abuse.   Diet: Encouraged to adjust caloric intake to maintain  or achieve ideal body weight, to reduce intake of dietary saturated fat and total fat, to limit sodium intake by avoiding high sodium foods and not adding table salt, and to maintain adequate dietary potassium and calcium preferably from fresh fruits, vegetables, and low-fat dairy products.    stressed the importance of regular exercise  Injury prevention: Discussed safety belts, safety helmets, smoke detector, smoking near bedding or upholstery.   Dental health: Discussed importance of regular tooth brushing, flossing, and dental visits.   Follow up plan: NEXT PREVENTATIVE PHYSICAL DUE IN 1 YEAR. Return in about 1 year (around 05/11/2018) for Physical.

## 2017-05-11 NOTE — Assessment & Plan Note (Signed)
Fully resolved. Call with any concerns.

## 2017-05-11 NOTE — Patient Instructions (Addendum)

## 2017-05-12 ENCOUNTER — Encounter: Payer: Self-pay | Admitting: Family Medicine

## 2017-05-12 LAB — LIPID PANEL W/O CHOL/HDL RATIO
CHOLESTEROL TOTAL: 178 mg/dL (ref 100–199)
HDL: 55 mg/dL (ref >39)
LDL Calculated: 93 mg/dL (ref 0–99)
TRIGLYCERIDES: 150 mg/dL — AB (ref 0–149)
VLDL CHOLESTEROL CAL: 30 mg/dL (ref 5–40)

## 2017-05-12 LAB — CBC WITH DIFFERENTIAL/PLATELET
BASOS ABS: 0 10*3/uL (ref 0.0–0.2)
Basos: 1 %
EOS (ABSOLUTE): 0.3 10*3/uL (ref 0.0–0.4)
EOS: 5 %
HEMATOCRIT: 45.4 % (ref 37.5–51.0)
HEMOGLOBIN: 14.8 g/dL (ref 13.0–17.7)
IMMATURE GRANS (ABS): 0 10*3/uL (ref 0.0–0.1)
IMMATURE GRANULOCYTES: 0 %
LYMPHS: 28 %
Lymphocytes Absolute: 1.6 10*3/uL (ref 0.7–3.1)
MCH: 30.2 pg (ref 26.6–33.0)
MCHC: 32.6 g/dL (ref 31.5–35.7)
MCV: 93 fL (ref 79–97)
MONOCYTES: 10 %
Monocytes Absolute: 0.6 10*3/uL (ref 0.1–0.9)
NEUTROS PCT: 56 %
Neutrophils Absolute: 3.2 10*3/uL (ref 1.4–7.0)
Platelets: 189 10*3/uL (ref 150–379)
RBC: 4.9 x10E6/uL (ref 4.14–5.80)
RDW: 13.6 % (ref 12.3–15.4)
WBC: 5.8 10*3/uL (ref 3.4–10.8)

## 2017-05-12 LAB — COMPREHENSIVE METABOLIC PANEL
ALBUMIN: 4.5 g/dL (ref 3.5–5.5)
ALK PHOS: 68 IU/L (ref 39–117)
ALT: 19 IU/L (ref 0–44)
AST: 19 IU/L (ref 0–40)
Albumin/Globulin Ratio: 2 (ref 1.2–2.2)
BUN/Creatinine Ratio: 22 — ABNORMAL HIGH (ref 9–20)
BUN: 22 mg/dL (ref 6–24)
Bilirubin Total: 0.9 mg/dL (ref 0.0–1.2)
CO2: 29 mmol/L (ref 18–29)
Calcium: 9.8 mg/dL (ref 8.7–10.2)
Chloride: 99 mmol/L (ref 96–106)
Creatinine, Ser: 1 mg/dL (ref 0.76–1.27)
GFR calc Af Amer: 100 mL/min/{1.73_m2} (ref >59)
GFR calc non Af Amer: 86 mL/min/{1.73_m2} (ref >59)
GLUCOSE: 77 mg/dL (ref 65–99)
Globulin, Total: 2.3 g/dL (ref 1.5–4.5)
Potassium: 4.9 mmol/L (ref 3.5–5.2)
Sodium: 140 mmol/L (ref 134–144)
Total Protein: 6.8 g/dL (ref 6.0–8.5)

## 2017-05-12 LAB — TSH: TSH: 1.22 u[IU]/mL (ref 0.450–4.500)

## 2017-05-12 LAB — HIV ANTIBODY (ROUTINE TESTING W REFLEX): HIV Screen 4th Generation wRfx: NONREACTIVE

## 2017-05-12 LAB — HEPATITIS C ANTIBODY: Hep C Virus Ab: <0.1 s/co ratio (ref 0.0–0.9)

## 2017-05-12 LAB — PSA: Prostate Specific Ag, Serum: 0.3 ng/mL (ref 0.0–4.0)

## 2017-07-25 DIAGNOSIS — L820 Inflamed seborrheic keratosis: Secondary | ICD-10-CM | POA: Diagnosis not present

## 2017-07-25 DIAGNOSIS — L570 Actinic keratosis: Secondary | ICD-10-CM | POA: Diagnosis not present

## 2017-12-22 DIAGNOSIS — J029 Acute pharyngitis, unspecified: Secondary | ICD-10-CM | POA: Diagnosis not present

## 2017-12-22 DIAGNOSIS — M791 Myalgia, unspecified site: Secondary | ICD-10-CM | POA: Diagnosis not present

## 2017-12-22 DIAGNOSIS — J018 Other acute sinusitis: Secondary | ICD-10-CM | POA: Diagnosis not present

## 2018-02-07 DIAGNOSIS — M5441 Lumbago with sciatica, right side: Secondary | ICD-10-CM | POA: Diagnosis not present

## 2018-02-07 DIAGNOSIS — M5136 Other intervertebral disc degeneration, lumbar region: Secondary | ICD-10-CM | POA: Diagnosis not present

## 2018-02-07 DIAGNOSIS — M9904 Segmental and somatic dysfunction of sacral region: Secondary | ICD-10-CM | POA: Diagnosis not present

## 2018-02-07 DIAGNOSIS — M9903 Segmental and somatic dysfunction of lumbar region: Secondary | ICD-10-CM | POA: Diagnosis not present

## 2018-02-08 DIAGNOSIS — M9903 Segmental and somatic dysfunction of lumbar region: Secondary | ICD-10-CM | POA: Diagnosis not present

## 2018-02-08 DIAGNOSIS — M5136 Other intervertebral disc degeneration, lumbar region: Secondary | ICD-10-CM | POA: Diagnosis not present

## 2018-02-08 DIAGNOSIS — M5441 Lumbago with sciatica, right side: Secondary | ICD-10-CM | POA: Diagnosis not present

## 2018-02-08 DIAGNOSIS — M9904 Segmental and somatic dysfunction of sacral region: Secondary | ICD-10-CM | POA: Diagnosis not present

## 2018-02-09 DIAGNOSIS — M5136 Other intervertebral disc degeneration, lumbar region: Secondary | ICD-10-CM | POA: Diagnosis not present

## 2018-02-09 DIAGNOSIS — M5441 Lumbago with sciatica, right side: Secondary | ICD-10-CM | POA: Diagnosis not present

## 2018-02-09 DIAGNOSIS — M9904 Segmental and somatic dysfunction of sacral region: Secondary | ICD-10-CM | POA: Diagnosis not present

## 2018-02-09 DIAGNOSIS — M9903 Segmental and somatic dysfunction of lumbar region: Secondary | ICD-10-CM | POA: Diagnosis not present

## 2018-02-13 DIAGNOSIS — M5441 Lumbago with sciatica, right side: Secondary | ICD-10-CM | POA: Diagnosis not present

## 2018-02-13 DIAGNOSIS — M5136 Other intervertebral disc degeneration, lumbar region: Secondary | ICD-10-CM | POA: Diagnosis not present

## 2018-02-13 DIAGNOSIS — M9904 Segmental and somatic dysfunction of sacral region: Secondary | ICD-10-CM | POA: Diagnosis not present

## 2018-02-13 DIAGNOSIS — M9903 Segmental and somatic dysfunction of lumbar region: Secondary | ICD-10-CM | POA: Diagnosis not present

## 2018-02-14 DIAGNOSIS — M5136 Other intervertebral disc degeneration, lumbar region: Secondary | ICD-10-CM | POA: Diagnosis not present

## 2018-02-14 DIAGNOSIS — M5441 Lumbago with sciatica, right side: Secondary | ICD-10-CM | POA: Diagnosis not present

## 2018-02-14 DIAGNOSIS — M9903 Segmental and somatic dysfunction of lumbar region: Secondary | ICD-10-CM | POA: Diagnosis not present

## 2018-02-14 DIAGNOSIS — M9904 Segmental and somatic dysfunction of sacral region: Secondary | ICD-10-CM | POA: Diagnosis not present

## 2018-02-16 DIAGNOSIS — M5136 Other intervertebral disc degeneration, lumbar region: Secondary | ICD-10-CM | POA: Diagnosis not present

## 2018-02-16 DIAGNOSIS — M9903 Segmental and somatic dysfunction of lumbar region: Secondary | ICD-10-CM | POA: Diagnosis not present

## 2018-02-16 DIAGNOSIS — M9904 Segmental and somatic dysfunction of sacral region: Secondary | ICD-10-CM | POA: Diagnosis not present

## 2018-02-16 DIAGNOSIS — M5441 Lumbago with sciatica, right side: Secondary | ICD-10-CM | POA: Diagnosis not present

## 2018-02-20 DIAGNOSIS — M5136 Other intervertebral disc degeneration, lumbar region: Secondary | ICD-10-CM | POA: Diagnosis not present

## 2018-02-20 DIAGNOSIS — M9903 Segmental and somatic dysfunction of lumbar region: Secondary | ICD-10-CM | POA: Diagnosis not present

## 2018-02-20 DIAGNOSIS — M9904 Segmental and somatic dysfunction of sacral region: Secondary | ICD-10-CM | POA: Diagnosis not present

## 2018-02-20 DIAGNOSIS — M5441 Lumbago with sciatica, right side: Secondary | ICD-10-CM | POA: Diagnosis not present

## 2018-02-21 DIAGNOSIS — M5136 Other intervertebral disc degeneration, lumbar region: Secondary | ICD-10-CM | POA: Diagnosis not present

## 2018-02-21 DIAGNOSIS — M9903 Segmental and somatic dysfunction of lumbar region: Secondary | ICD-10-CM | POA: Diagnosis not present

## 2018-02-21 DIAGNOSIS — M5441 Lumbago with sciatica, right side: Secondary | ICD-10-CM | POA: Diagnosis not present

## 2018-02-21 DIAGNOSIS — M9904 Segmental and somatic dysfunction of sacral region: Secondary | ICD-10-CM | POA: Diagnosis not present

## 2018-02-22 DIAGNOSIS — M9903 Segmental and somatic dysfunction of lumbar region: Secondary | ICD-10-CM | POA: Diagnosis not present

## 2018-02-22 DIAGNOSIS — M5136 Other intervertebral disc degeneration, lumbar region: Secondary | ICD-10-CM | POA: Diagnosis not present

## 2018-02-22 DIAGNOSIS — M5441 Lumbago with sciatica, right side: Secondary | ICD-10-CM | POA: Diagnosis not present

## 2018-02-22 DIAGNOSIS — M9904 Segmental and somatic dysfunction of sacral region: Secondary | ICD-10-CM | POA: Diagnosis not present

## 2018-02-27 DIAGNOSIS — M9903 Segmental and somatic dysfunction of lumbar region: Secondary | ICD-10-CM | POA: Diagnosis not present

## 2018-02-27 DIAGNOSIS — M5136 Other intervertebral disc degeneration, lumbar region: Secondary | ICD-10-CM | POA: Diagnosis not present

## 2018-02-27 DIAGNOSIS — M5441 Lumbago with sciatica, right side: Secondary | ICD-10-CM | POA: Diagnosis not present

## 2018-02-27 DIAGNOSIS — M9904 Segmental and somatic dysfunction of sacral region: Secondary | ICD-10-CM | POA: Diagnosis not present

## 2018-03-01 DIAGNOSIS — M9904 Segmental and somatic dysfunction of sacral region: Secondary | ICD-10-CM | POA: Diagnosis not present

## 2018-03-01 DIAGNOSIS — M5136 Other intervertebral disc degeneration, lumbar region: Secondary | ICD-10-CM | POA: Diagnosis not present

## 2018-03-01 DIAGNOSIS — M5441 Lumbago with sciatica, right side: Secondary | ICD-10-CM | POA: Diagnosis not present

## 2018-03-01 DIAGNOSIS — M9903 Segmental and somatic dysfunction of lumbar region: Secondary | ICD-10-CM | POA: Diagnosis not present

## 2018-03-02 DIAGNOSIS — M9904 Segmental and somatic dysfunction of sacral region: Secondary | ICD-10-CM | POA: Diagnosis not present

## 2018-03-02 DIAGNOSIS — M5136 Other intervertebral disc degeneration, lumbar region: Secondary | ICD-10-CM | POA: Diagnosis not present

## 2018-03-02 DIAGNOSIS — M9903 Segmental and somatic dysfunction of lumbar region: Secondary | ICD-10-CM | POA: Diagnosis not present

## 2018-03-02 DIAGNOSIS — M5441 Lumbago with sciatica, right side: Secondary | ICD-10-CM | POA: Diagnosis not present

## 2018-03-07 DIAGNOSIS — M9903 Segmental and somatic dysfunction of lumbar region: Secondary | ICD-10-CM | POA: Diagnosis not present

## 2018-03-07 DIAGNOSIS — M9904 Segmental and somatic dysfunction of sacral region: Secondary | ICD-10-CM | POA: Diagnosis not present

## 2018-03-07 DIAGNOSIS — M5441 Lumbago with sciatica, right side: Secondary | ICD-10-CM | POA: Diagnosis not present

## 2018-03-07 DIAGNOSIS — M5136 Other intervertebral disc degeneration, lumbar region: Secondary | ICD-10-CM | POA: Diagnosis not present

## 2018-03-08 DIAGNOSIS — M9903 Segmental and somatic dysfunction of lumbar region: Secondary | ICD-10-CM | POA: Diagnosis not present

## 2018-03-08 DIAGNOSIS — M5441 Lumbago with sciatica, right side: Secondary | ICD-10-CM | POA: Diagnosis not present

## 2018-03-08 DIAGNOSIS — M5136 Other intervertebral disc degeneration, lumbar region: Secondary | ICD-10-CM | POA: Diagnosis not present

## 2018-03-08 DIAGNOSIS — M9904 Segmental and somatic dysfunction of sacral region: Secondary | ICD-10-CM | POA: Diagnosis not present

## 2018-03-09 DIAGNOSIS — M9903 Segmental and somatic dysfunction of lumbar region: Secondary | ICD-10-CM | POA: Diagnosis not present

## 2018-03-09 DIAGNOSIS — M5136 Other intervertebral disc degeneration, lumbar region: Secondary | ICD-10-CM | POA: Diagnosis not present

## 2018-03-09 DIAGNOSIS — M9904 Segmental and somatic dysfunction of sacral region: Secondary | ICD-10-CM | POA: Diagnosis not present

## 2018-03-09 DIAGNOSIS — M5441 Lumbago with sciatica, right side: Secondary | ICD-10-CM | POA: Diagnosis not present

## 2018-03-15 DIAGNOSIS — M5441 Lumbago with sciatica, right side: Secondary | ICD-10-CM | POA: Diagnosis not present

## 2018-03-15 DIAGNOSIS — M5136 Other intervertebral disc degeneration, lumbar region: Secondary | ICD-10-CM | POA: Diagnosis not present

## 2018-03-15 DIAGNOSIS — M9904 Segmental and somatic dysfunction of sacral region: Secondary | ICD-10-CM | POA: Diagnosis not present

## 2018-03-15 DIAGNOSIS — M9903 Segmental and somatic dysfunction of lumbar region: Secondary | ICD-10-CM | POA: Diagnosis not present

## 2018-03-16 DIAGNOSIS — M9904 Segmental and somatic dysfunction of sacral region: Secondary | ICD-10-CM | POA: Diagnosis not present

## 2018-03-16 DIAGNOSIS — M5136 Other intervertebral disc degeneration, lumbar region: Secondary | ICD-10-CM | POA: Diagnosis not present

## 2018-03-16 DIAGNOSIS — M9903 Segmental and somatic dysfunction of lumbar region: Secondary | ICD-10-CM | POA: Diagnosis not present

## 2018-03-16 DIAGNOSIS — M5441 Lumbago with sciatica, right side: Secondary | ICD-10-CM | POA: Diagnosis not present

## 2018-03-18 DIAGNOSIS — R42 Dizziness and giddiness: Secondary | ICD-10-CM | POA: Diagnosis not present

## 2018-03-20 DIAGNOSIS — M5136 Other intervertebral disc degeneration, lumbar region: Secondary | ICD-10-CM | POA: Diagnosis not present

## 2018-03-20 DIAGNOSIS — M5441 Lumbago with sciatica, right side: Secondary | ICD-10-CM | POA: Diagnosis not present

## 2018-03-20 DIAGNOSIS — M9903 Segmental and somatic dysfunction of lumbar region: Secondary | ICD-10-CM | POA: Diagnosis not present

## 2018-03-20 DIAGNOSIS — M9904 Segmental and somatic dysfunction of sacral region: Secondary | ICD-10-CM | POA: Diagnosis not present

## 2018-03-21 ENCOUNTER — Encounter: Payer: Self-pay | Admitting: Family Medicine

## 2018-03-21 ENCOUNTER — Ambulatory Visit: Payer: BLUE CROSS/BLUE SHIELD | Admitting: Family Medicine

## 2018-03-21 VITALS — BP 130/80 | HR 91 | Temp 99.0°F | Wt 199.4 lb

## 2018-03-21 DIAGNOSIS — Z13220 Encounter for screening for lipoid disorders: Secondary | ICD-10-CM

## 2018-03-21 DIAGNOSIS — R079 Chest pain, unspecified: Secondary | ICD-10-CM | POA: Diagnosis not present

## 2018-03-21 DIAGNOSIS — Z125 Encounter for screening for malignant neoplasm of prostate: Secondary | ICD-10-CM | POA: Diagnosis not present

## 2018-03-21 DIAGNOSIS — R42 Dizziness and giddiness: Secondary | ICD-10-CM

## 2018-03-21 LAB — UA/M W/RFLX CULTURE, ROUTINE
BILIRUBIN UA: NEGATIVE
GLUCOSE, UA: NEGATIVE
Ketones, UA: NEGATIVE
Leukocytes, UA: NEGATIVE
Nitrite, UA: NEGATIVE
PH UA: 5.5 (ref 5.0–7.5)
PROTEIN UA: NEGATIVE
RBC UA: NEGATIVE
SPEC GRAV UA: 1.015 (ref 1.005–1.030)
Urobilinogen, Ur: 0.2 mg/dL (ref 0.2–1.0)

## 2018-03-21 LAB — LP+ALT+AST PICCOLO, WAIVED
ALT (SGPT) Piccolo, Waived: 29 U/L (ref 10–47)
AST (SGOT) PICCOLO, WAIVED: 27 U/L (ref 11–38)
CHOL/HDL RATIO PICCOLO,WAIVE: 3.1 mg/dL
Cholesterol Piccolo, Waived: 181 mg/dL (ref <200)
HDL Chol Piccolo, Waived: 58 mg/dL (ref >59)
LDL CHOL CALC PICCOLO WAIVED: 96 mg/dL (ref <100)
Triglycerides Piccolo,Waived: 135 mg/dL (ref <150)
VLDL Chol Calc Piccolo,Waive: 27 mg/dL (ref <30)

## 2018-03-21 NOTE — Progress Notes (Signed)
BP 130/80 (BP Location: Left Arm, Patient Position: Sitting, Cuff Size: Large)   Pulse 91   Temp 99 F (37.2 C) (Oral)   Wt 199 lb 7 oz (90.5 kg)   SpO2 97%   BMI 29.97 kg/m    Subjective:    Patient ID: Richard Houston, male    DOB: 08-Feb-1966, 52 y.o.   MRN: [id number]  HPI: Richard Houston is a 52 y.o. male  Chief Complaint  Patient presents with  . Hypotension    episode of hypotension 4/12. Numbing, tingling and faint feeling.  Beginning to experience mild pain on right side of chest.   DIZZINESS- On Friday night, he woke up in the middle of the night with a charlie horse in his leg. He was having a lot pain and was rubbing and trying to get it to go away, got up and was walking around and then the world started spinning and he got very dizzy and fell to his knees. Wife took his blood pressure and it was 66/46, was very confused. Called EMS and they came out and thought that it was dehydration, BP was better into the 90s sytolic. He did not go to the ER at that time. Started drinking a lot of gatorade. Since then, not feeling right, still feeling dizzy Duration: 4 days Description of symptoms: lightheaded Duration of episode: minutes Dizziness frequency: recurrent, 3-4x a day Provoking factors: none Aggravating factors:  none Triggered by rolling over in bed: no Triggered by bending over: no Aggravated by head movement: no Aggravated by exertion, coughing, loud noises: no Recent head injury: no Recent or current viral symptoms: no History of vasovagal episodes: no Nausea: no Vomiting: no Tinnitus: yes- chronic Hearing loss: yes- chronic Aural fullness: yes Headache: yes- with allergies Photophobia/phonophobia: yes- with migraines 3-4x a month Unsteady gait: no Postural instability: no Diplopia, dysarthria, dysphagia or weakness: no Related to exertion: no Pallor: no Diaphoresis: no Dyspnea: no Chest pain: yes  CHEST PAIN Time since onset: couple of  months Onset: sudden Quality: scratching the inside of his chest Severity: moderate Location: upper left Radiation: none Episode duration: waxes and wanes but lasts at least a couple of hours Frequency: 2x a week Related to exertion: no Activity when pain started: at radom Trauma: no Anxiety/recent stressors: yes- son has been very sick and recently was in the ICU and had to have the crash team called Status: fluctuating Treatments attempted: antacids- helped a little bit  Current pain status: in pain Shortness of breath: no Cough: no Nausea: no Diaphoresis: no Heartburn: no Palpitations: yes- hard beats occasionally  Relevant past medical, surgical, family and social history reviewed and updated as indicated. Interim medical history since our last visit reviewed. Allergies and medications reviewed and updated.  Review of Systems  Constitutional: Negative.   HENT: Positive for congestion and hearing loss. Negative for dental problem, drooling, ear discharge, ear pain, facial swelling, mouth sores, nosebleeds, postnasal drip, rhinorrhea, sinus pressure, sinus pain, sneezing, sore throat, tinnitus, trouble swallowing and voice change.   Respiratory: Negative.   Cardiovascular: Positive for chest pain. Negative for palpitations and leg swelling.  Neurological: Positive for dizziness, light-headedness and headaches. Negative for tremors, seizures, syncope, facial asymmetry, speech difficulty, weakness and numbness.  Psychiatric/Behavioral: Negative.     Per HPI unless specifically indicated above     Objective:    BP 130/80 (BP Location: Left Arm, Patient Position: Sitting, Cuff Size: Large)   Pulse 91   Temp 99 F (37.2  C) (Oral)   Wt 199 lb 7 oz (90.5 kg)   SpO2 97%   BMI 29.97 kg/m   Wt Readings from Last 3 Encounters:  03/21/18 199 lb 7 oz (90.5 kg)  05/11/17 184 lb 8 oz (83.7 kg)  01/16/16 179 lb (81.2 kg)    Orthostatic VS for the past 24 hrs:  BP- Lying BP-  Sitting BP- Standing at 0 minutes  03/21/18 1447 121/78 119/81 119/76    Physical Exam  Constitutional: He is oriented to person, place, and time. He appears well-developed and well-nourished. No distress.  HENT:  Head: Normocephalic and atraumatic.  Right Ear: Hearing and external ear normal.  Left Ear: Hearing and external ear normal.  Nose: Nose normal.  Mouth/Throat: Oropharynx is clear and moist. No oropharyngeal exudate.  Eyes: Pupils are equal, round, and reactive to light. Conjunctivae, EOM and lids are normal. Right eye exhibits no discharge. Left eye exhibits no discharge. No scleral icterus.  Neck: Normal range of motion. Neck supple. No JVD present. No tracheal deviation present. No thyromegaly present.  Cardiovascular: Normal rate, regular rhythm, normal heart sounds and intact distal pulses. Exam reveals no gallop and no friction rub.  No murmur heard. No caritid bruits  Pulmonary/Chest: Effort normal and breath sounds normal. No stridor. No respiratory distress. He has no wheezes. He has no rales. He exhibits no tenderness.  Musculoskeletal: Normal range of motion.  Lymphadenopathy:    He has no cervical adenopathy.  Neurological: He is alert and oriented to person, place, and time. He displays normal reflexes. No cranial nerve deficit or sensory deficit. He exhibits normal muscle tone. Coordination normal.  Skin: Skin is warm, dry and intact. Capillary refill takes less than 2 seconds. No rash noted. He is not diaphoretic. No erythema. No pallor.  Psychiatric: He has a normal mood and affect. His speech is normal and behavior is normal. Judgment and thought content normal. Cognition and memory are normal.  Nursing note and vitals reviewed.   Results for orders placed or performed in visit on 05/11/17  Microscopic Examination  Result Value Ref Range   WBC, UA None seen 0 - 5 /hpf   RBC, UA 0-2 0 - 2 /hpf   Epithelial Cells (non renal) CANCELED    Bacteria, UA None seen  None seen/Few  CBC with Differential/Platelet  Result Value Ref Range   WBC 5.8 3.4 - 10.8 x10E3/uL   RBC 4.90 4.14 - 5.80 x10E6/uL   Hemoglobin 14.8 13.0 - 17.7 g/dL   Hematocrit 60.4 54.0 - 51.0 %   MCV 93 79 - 97 fL   MCH 30.2 26.6 - 33.0 pg   MCHC 32.6 31.5 - 35.7 g/dL   RDW 98.1 19.1 - 47.8 %   Platelets 189 150 - 379 x10E3/uL   Neutrophils 56 Not Estab. %   Lymphs 28 Not Estab. %   Monocytes 10 Not Estab. %   Eos 5 Not Estab. %   Basos 1 Not Estab. %   Neutrophils Absolute 3.2 1.4 - 7.0 x10E3/uL   Lymphocytes Absolute 1.6 0.7 - 3.1 x10E3/uL   Monocytes Absolute 0.6 0.1 - 0.9 x10E3/uL   EOS (ABSOLUTE) 0.3 0.0 - 0.4 x10E3/uL   Basophils Absolute 0.0 0.0 - 0.2 x10E3/uL   Immature Granulocytes 0 Not Estab. %   Immature Grans (Abs) 0.0 0.0 - 0.1 x10E3/uL  Comprehensive metabolic panel  Result Value Ref Range   Glucose 77 65 - 99 mg/dL   BUN 22 6 - 24  mg/dL   Creatinine, Ser 1.61 0.76 - 1.27 mg/dL   GFR calc non Af Amer 86 >59 mL/min/1.73   GFR calc Af Amer 100 >59 mL/min/1.73   BUN/Creatinine Ratio 22 (H) 9 - 20   Sodium 140 134 - 144 mmol/L   Potassium 4.9 3.5 - 5.2 mmol/L   Chloride 99 96 - 106 mmol/L   CO2 29 18 - 29 mmol/L   Calcium 9.8 8.7 - 10.2 mg/dL   Total Protein 6.8 6.0 - 8.5 g/dL   Albumin 4.5 3.5 - 5.5 g/dL   Globulin, Total 2.3 1.5 - 4.5 g/dL   Albumin/Globulin Ratio 2.0 1.2 - 2.2   Bilirubin Total 0.9 0.0 - 1.2 mg/dL   Alkaline Phosphatase 68 39 - 117 IU/L   AST 19 0 - 40 IU/L   ALT 19 0 - 44 IU/L  Lipid Panel w/o Chol/HDL Ratio  Result Value Ref Range   Cholesterol, Total 178 100 - 199 mg/dL   Triglycerides 096 (H) 0 - 149 mg/dL   HDL 55 >04 mg/dL   VLDL Cholesterol Cal 30 5 - 40 mg/dL   LDL Calculated 93 0 - 99 mg/dL  PSA  Result Value Ref Range   Prostate Specific Ag, Serum 0.3 0.0 - 4.0 ng/mL  TSH  Result Value Ref Range   TSH 1.220 0.450 - 4.500 uIU/mL  UA/M w/rflx Culture, Routine  Result Value Ref Range   Specific Gravity, UA 1.010  1.005 - 1.030   pH, UA 7.0 5.0 - 7.5   Color, UA Yellow Yellow   Appearance Ur Clear Clear   Leukocytes, UA Negative Negative   Protein, UA Negative Negative/Trace   Glucose, UA Negative Negative   Ketones, UA Negative Negative   RBC, UA Negative Negative   Bilirubin, UA Negative Negative   Urobilinogen, Ur 0.2 0.2 - 1.0 mg/dL   Nitrite, UA Negative Negative   Microscopic Examination See below:   HIV antibody  Result Value Ref Range   HIV Screen 4th Generation wRfx Non Reactive Non Reactive  Hepatitis C Antibody  Result Value Ref Range   Hep C Virus Ab <0.1 0.0 - 0.9 s/co ratio      Assessment & Plan:   Problem List Items Addressed This Visit      Other   Dizziness - Primary    Of unclear etiology. Possibly due to dehydration, especially given charlie horse when it happened. Will check labs- drawn today. Await results. Encourage increasing fluids. Orthostatics normal today. Daily headaches with 3-4x a week migraines is concerning, first time he is mentioning this- if labs normal will discuss this further and evaluate at follow up.       Relevant Orders   CBC with Differential/Platelet   Comprehensive metabolic panel   Thyroid Panel With TSH   UA/M w/rflx Culture, Routine   Ambulatory referral to Cardiology    Other Visit Diagnoses    Chest pain, unspecified type       Likely stress related. Will check labs. Patient is very anxious about this and would like to see cardiology. Referral generated today.   Relevant Orders   EKG 12-Lead (Completed)   Ambulatory referral to Cardiology   Screening for prostate cancer       Labs drawn today. Await results.    Relevant Orders   PSA   Screening for cholesterol level       Labs drawn today. Await results.    Relevant Orders   [address], [address]  Follow up plan: Return in about 7 weeks (around 05/09/2018), or after 05/11/18, for Physical and follow up headaches.

## 2018-03-21 NOTE — Assessment & Plan Note (Signed)
Of unclear etiology. Possibly due to dehydration, especially given charlie horse when it happened. Will check labs- drawn today. Await results. Encourage increasing fluids. Orthostatics normal today. Daily headaches with 3-4x a week migraines is concerning, first time he is mentioning this- if labs normal will discuss this further and evaluate at follow up.

## 2018-03-22 ENCOUNTER — Encounter: Payer: Self-pay | Admitting: Family Medicine

## 2018-03-22 LAB — COMPREHENSIVE METABOLIC PANEL
A/G RATIO: 1.9 (ref 1.2–2.2)
ALBUMIN: 4.3 g/dL (ref 3.5–5.5)
ALT: 20 IU/L (ref 0–44)
AST: 16 IU/L (ref 0–40)
Alkaline Phosphatase: 74 IU/L (ref 39–117)
BILIRUBIN TOTAL: 0.7 mg/dL (ref 0.0–1.2)
BUN / CREAT RATIO: 16 (ref 9–20)
BUN: 15 mg/dL (ref 6–24)
CALCIUM: 9.4 mg/dL (ref 8.7–10.2)
CHLORIDE: 99 mmol/L (ref 96–106)
CO2: 28 mmol/L (ref 20–29)
Creatinine, Ser: 0.91 mg/dL (ref 0.76–1.27)
GFR, EST AFRICAN AMERICAN: 111 mL/min/{1.73_m2} (ref >59)
GFR, EST NON AFRICAN AMERICAN: 96 mL/min/{1.73_m2} (ref >59)
GLUCOSE: 93 mg/dL (ref 65–99)
Globulin, Total: 2.3 g/dL (ref 1.5–4.5)
Potassium: 4 mmol/L (ref 3.5–5.2)
Sodium: 140 mmol/L (ref 134–144)
TOTAL PROTEIN: 6.6 g/dL (ref 6.0–8.5)

## 2018-03-22 LAB — CBC WITH DIFFERENTIAL/PLATELET
BASOS: 1 %
Basophils Absolute: 0.1 10*3/uL (ref 0.0–0.2)
EOS (ABSOLUTE): 0.2 10*3/uL (ref 0.0–0.4)
Eos: 3 %
HEMOGLOBIN: 14.4 g/dL (ref 13.0–17.7)
Hematocrit: 43.9 % (ref 37.5–51.0)
IMMATURE GRANULOCYTES: 0 %
Immature Grans (Abs): 0 10*3/uL (ref 0.0–0.1)
LYMPHS: 26 %
Lymphocytes Absolute: 2 10*3/uL (ref 0.7–3.1)
MCH: 30.5 pg (ref 26.6–33.0)
MCHC: 32.8 g/dL (ref 31.5–35.7)
MCV: 93 fL (ref 79–97)
MONOCYTES: 8 %
Monocytes Absolute: 0.6 10*3/uL (ref 0.1–0.9)
NEUTROS ABS: 4.8 10*3/uL (ref 1.4–7.0)
NEUTROS PCT: 62 %
Platelets: 211 10*3/uL (ref 150–379)
RBC: 4.72 x10E6/uL (ref 4.14–5.80)
RDW: 13.8 % (ref 12.3–15.4)
WBC: 7.7 10*3/uL (ref 3.4–10.8)

## 2018-03-22 LAB — THYROID PANEL WITH TSH
FREE THYROXINE INDEX: 1.8 (ref 1.2–4.9)
T3 UPTAKE RATIO: 25 % (ref 24–39)
T4, Total: 7.2 ug/dL (ref 4.5–12.0)
TSH: 1.39 u[IU]/mL (ref 0.450–4.500)

## 2018-03-22 LAB — PSA: Prostate Specific Ag, Serum: 0.3 ng/mL (ref 0.0–4.0)

## 2018-05-15 ENCOUNTER — Encounter: Payer: BLUE CROSS/BLUE SHIELD | Admitting: Family Medicine

## 2018-06-07 ENCOUNTER — Ambulatory Visit: Payer: Self-pay | Admitting: Internal Medicine

## 2018-09-28 DIAGNOSIS — K209 Esophagitis, unspecified: Secondary | ICD-10-CM | POA: Diagnosis not present

## 2018-09-28 DIAGNOSIS — K200 Eosinophilic esophagitis: Secondary | ICD-10-CM | POA: Diagnosis not present

## 2018-09-28 DIAGNOSIS — J45909 Unspecified asthma, uncomplicated: Secondary | ICD-10-CM | POA: Diagnosis not present

## 2018-09-28 DIAGNOSIS — D721 Eosinophilia: Secondary | ICD-10-CM | POA: Diagnosis not present

## 2018-09-28 DIAGNOSIS — R1310 Dysphagia, unspecified: Secondary | ICD-10-CM | POA: Diagnosis not present

## 2018-09-28 DIAGNOSIS — K210 Gastro-esophageal reflux disease with esophagitis: Secondary | ICD-10-CM | POA: Diagnosis not present

## 2018-09-28 DIAGNOSIS — K222 Esophageal obstruction: Secondary | ICD-10-CM | POA: Diagnosis not present

## 2018-09-28 DIAGNOSIS — R12 Heartburn: Secondary | ICD-10-CM | POA: Diagnosis not present

## 2018-09-28 DIAGNOSIS — K228 Other specified diseases of esophagus: Secondary | ICD-10-CM | POA: Diagnosis not present

## 2018-09-28 DIAGNOSIS — K449 Diaphragmatic hernia without obstruction or gangrene: Secondary | ICD-10-CM | POA: Diagnosis not present

## 2018-12-31 DIAGNOSIS — J208 Acute bronchitis due to other specified organisms: Secondary | ICD-10-CM | POA: Diagnosis not present

## 2019-01-03 DIAGNOSIS — J101 Influenza due to other identified influenza virus with other respiratory manifestations: Secondary | ICD-10-CM | POA: Diagnosis not present

## 2019-06-22 DIAGNOSIS — Z6826 Body mass index (BMI) 26.0-26.9, adult: Secondary | ICD-10-CM | POA: Diagnosis not present

## 2019-06-22 DIAGNOSIS — R030 Elevated blood-pressure reading, without diagnosis of hypertension: Secondary | ICD-10-CM | POA: Diagnosis not present

## 2019-06-22 DIAGNOSIS — J019 Acute sinusitis, unspecified: Secondary | ICD-10-CM | POA: Diagnosis not present

## 2019-06-22 DIAGNOSIS — R05 Cough: Secondary | ICD-10-CM | POA: Diagnosis not present

## 2019-06-22 DIAGNOSIS — J22 Unspecified acute lower respiratory infection: Secondary | ICD-10-CM | POA: Diagnosis not present

## 2019-07-27 DIAGNOSIS — S9032XA Contusion of left foot, initial encounter: Secondary | ICD-10-CM | POA: Diagnosis not present

## 2019-07-27 DIAGNOSIS — S99922A Unspecified injury of left foot, initial encounter: Secondary | ICD-10-CM | POA: Diagnosis not present

## 2019-07-27 DIAGNOSIS — W208XXA Other cause of strike by thrown, projected or falling object, initial encounter: Secondary | ICD-10-CM | POA: Diagnosis not present
